# Patient Record
Sex: Female | Born: 1975 | Hispanic: No | Marital: Single | Smoking: Never smoker
Health system: Southern US, Community
[De-identification: ages and names within clinical notes are randomized; demographics above are authoritative.]

## PROBLEM LIST (undated history)

## (undated) DIAGNOSIS — T7840XA Allergy, unspecified, initial encounter: Secondary | ICD-10-CM

## (undated) DIAGNOSIS — D259 Leiomyoma of uterus, unspecified: Secondary | ICD-10-CM

## (undated) HISTORY — DX: Allergy, unspecified, initial encounter: T78.40XA

## (undated) HISTORY — PX: FOOT ARTHRODESIS, MODIFIED MCBRIDE: SUR52

---

## 2001-06-22 ENCOUNTER — Other Ambulatory Visit: Admission: RE | Admit: 2001-06-22 | Discharge: 2001-06-22 | Payer: Self-pay | Admitting: Family Medicine

## 2009-10-27 ENCOUNTER — Other Ambulatory Visit: Admission: RE | Admit: 2009-10-27 | Discharge: 2009-10-27 | Payer: Self-pay | Admitting: Family Medicine

## 2009-10-30 ENCOUNTER — Encounter: Admission: RE | Admit: 2009-10-30 | Discharge: 2009-10-30 | Payer: Self-pay | Admitting: Family Medicine

## 2009-11-14 ENCOUNTER — Encounter: Admission: RE | Admit: 2009-11-14 | Discharge: 2009-11-14 | Payer: Self-pay | Admitting: Family Medicine

## 2010-11-23 ENCOUNTER — Other Ambulatory Visit: Payer: Self-pay | Admitting: Family Medicine

## 2010-11-23 DIAGNOSIS — Z1231 Encounter for screening mammogram for malignant neoplasm of breast: Secondary | ICD-10-CM

## 2010-12-23 ENCOUNTER — Ambulatory Visit
Admission: RE | Admit: 2010-12-23 | Discharge: 2010-12-23 | Disposition: A | Discharge status: Home / Self Care | Payer: BC Managed Care – PPO | Source: Ambulatory Visit | Attending: Family Medicine | Admitting: Family Medicine

## 2010-12-23 DIAGNOSIS — Z1231 Encounter for screening mammogram for malignant neoplasm of breast: Secondary | ICD-10-CM

## 2011-12-31 ENCOUNTER — Other Ambulatory Visit: Payer: Self-pay | Admitting: Family Medicine

## 2011-12-31 DIAGNOSIS — Z1231 Encounter for screening mammogram for malignant neoplasm of breast: Secondary | ICD-10-CM

## 2012-02-11 ENCOUNTER — Ambulatory Visit
Admission: RE | Admit: 2012-02-11 | Discharge: 2012-02-11 | Disposition: A | Discharge status: Home / Self Care | Payer: BC Managed Care – PPO | Source: Ambulatory Visit | Attending: Family Medicine | Admitting: Family Medicine

## 2012-02-11 DIAGNOSIS — Z1231 Encounter for screening mammogram for malignant neoplasm of breast: Secondary | ICD-10-CM

## 2012-12-05 ENCOUNTER — Other Ambulatory Visit: Payer: Self-pay | Admitting: Family Medicine

## 2012-12-05 ENCOUNTER — Other Ambulatory Visit (HOSPITAL_COMMUNITY)
Admission: RE | Admit: 2012-12-05 | Discharge: 2012-12-05 | Disposition: A | Discharge status: Home / Self Care | Payer: BC Managed Care – PPO | Source: Ambulatory Visit | Attending: Family Medicine | Admitting: Family Medicine

## 2012-12-05 DIAGNOSIS — Z124 Encounter for screening for malignant neoplasm of cervix: Secondary | ICD-10-CM | POA: Insufficient documentation

## 2013-03-28 ENCOUNTER — Other Ambulatory Visit: Payer: Self-pay

## 2013-03-28 DIAGNOSIS — Z1231 Encounter for screening mammogram for malignant neoplasm of breast: Secondary | ICD-10-CM

## 2013-04-19 ENCOUNTER — Ambulatory Visit: Payer: BC Managed Care – PPO

## 2013-04-24 ENCOUNTER — Ambulatory Visit
Admission: RE | Admit: 2013-04-24 | Discharge: 2013-04-24 | Disposition: A | Discharge status: Home / Self Care | Payer: BC Managed Care – PPO | Source: Ambulatory Visit

## 2013-04-24 DIAGNOSIS — Z1231 Encounter for screening mammogram for malignant neoplasm of breast: Secondary | ICD-10-CM

## 2014-04-19 ENCOUNTER — Other Ambulatory Visit: Payer: Self-pay

## 2014-04-19 DIAGNOSIS — Z1231 Encounter for screening mammogram for malignant neoplasm of breast: Secondary | ICD-10-CM

## 2014-04-26 ENCOUNTER — Ambulatory Visit
Admission: RE | Admit: 2014-04-26 | Discharge: 2014-04-26 | Disposition: A | Discharge status: Home / Self Care | Payer: PRIVATE HEALTH INSURANCE | Source: Ambulatory Visit

## 2014-04-26 DIAGNOSIS — Z1231 Encounter for screening mammogram for malignant neoplasm of breast: Secondary | ICD-10-CM

## 2015-01-29 ENCOUNTER — Ambulatory Visit (INDEPENDENT_AMBULATORY_CARE_PROVIDER_SITE_OTHER): Payer: Managed Care, Other (non HMO) | Admitting: Family Medicine

## 2015-01-29 VITALS — BP 104/62 | HR 96 | Temp 98.4°F | Resp 18 | Ht 65.0 in | Wt 132.0 lb

## 2015-01-29 DIAGNOSIS — J209 Acute bronchitis, unspecified: Secondary | ICD-10-CM | POA: Diagnosis not present

## 2015-01-29 MED ORDER — ALBUTEROL SULFATE 108 (90 BASE) MCG/ACT IN AEPB: 2.0000 | INHALATION_SPRAY | Freq: Four times a day (QID) | RESPIRATORY_TRACT | Status: DC | PRN

## 2015-01-29 MED ORDER — HYDROCODONE-HOMATROPINE 5-1.5 MG/5ML PO SYRP: 5.0000 mL | ORAL_SOLUTION | Freq: Three times a day (TID) | ORAL | Status: DC | PRN

## 2015-01-29 MED ORDER — AZITHROMYCIN 250 MG PO TABS: ORAL_TABLET | ORAL | Status: DC

## 2015-01-29 NOTE — Progress Notes (Signed)
Subjective:  This chart was scribed for Stephanie Haber MD, [name],scribe, at [facility].  This patient was seen in room  11 and the patient's care was started at 11:02 AM.    Patient ID: Barrie Mann, female    DOB: 05/09/1975, 39 y.o.   MRN: [id number]  Chief Complaint  Patient presents with  . Nasal Congestion    chest congestion started feeling sick since saturday   . Cough    some mucous not much     HPI HPI Comments: Stephanie Mann is a 39 y.o. female who presents to the [facility] complaining of congestion onset a few days ago.  Her symptoms started with a nasal congestion four days ago and woke up three days ago unable to speak as she had lost her voice.  She states that last night, she had a persistent mildly productive cough and was unable to sleep.  Patient works at [employer] and works from home. Patient has had bronchitis in the past. She is not requesting a work note today.    She denies fever/chills currently.   Peak flow: 330, predicted peak flow : 436.     There are no active problems to display for this patient.  Past Medical History  Diagnosis Date  . Allergy    Past Surgical History  Procedure Laterality Date  . Foot arthrodesis, modified mcbride     No Known Allergies Prior to Admission medications   Not on File   Social History   Social History  . Marital Status: Unknown    Spouse Name: N/A  . Number of Children: N/A  . Years of Education: N/A   Occupational History  . Not on file.   Social History Main Topics  . Smoking status: Never Smoker   . Smokeless tobacco: Not on file  . Alcohol Use: No  . Drug Use: No  . Sexual Activity: Not on file   Other Topics Concern  . Not on file   Social History Narrative  . No narrative on file       Review of Systems  Constitutional: Negative for fever and chills.  HENT: Positive for congestion.   Eyes: Negative for pain, redness and itching.    Respiratory: Positive for cough. Negative for choking.   Gastrointestinal: Negative for nausea and vomiting.  Musculoskeletal: Negative for neck pain and neck stiffness.  Skin: Negative for color change.  Neurological: Negative for syncope and speech difficulty.       Objective:   Physical Exam  Constitutional: She is oriented to person, place, and time. She appears well-developed and well-nourished. No distress.  HENT:  Head: Normocephalic and atraumatic.  Right Ear: External ear normal.  Left Ear: External ear normal.  Mouth/Throat: Oropharynx is clear and moist.  Eyes: Pupils are equal, round, and reactive to light.  Neck: Normal range of motion. Neck supple.  Pulmonary/Chest: Effort normal. No respiratory distress.  Coarse breath sounds  Musculoskeletal: Normal range of motion.  Neurological: She is alert and oriented to person, place, and time.  Skin: Skin is warm and dry.  Psychiatric: She has a normal mood and affect. Her behavior is normal.    Filed Vitals:   01/29/15 1043  BP: 104/62  Pulse: 96  Temp: 98.4 F (36.9 C)  TempSrc: Oral  Resp: 18  Height: 5\' 5"  (1.651 m)  Weight: 132 lb (59.875 kg)  SpO2: 98%   Patient is hoarse  Assessment & Plan:   This chart was scribed in my presence and reviewed by me personally.    ICD-9-CM ICD-10-CM   1. Acute bronchitis, unspecified organism 466.0 J20.9 azithromycin (ZITHROMAX) 250 MG tablet     HYDROcodone-homatropine (HYCODAN) 5-1.5 MG/5ML syrup     Albuterol Sulfate (PROAIR RESPICLICK) [id number] (90 BASE) MCG/ACT AEPB     Signed, Stephanie Haber, MD

## 2015-01-29 NOTE — Patient Instructions (Signed)

## 2015-03-26 ENCOUNTER — Other Ambulatory Visit: Payer: Self-pay

## 2015-03-26 DIAGNOSIS — Z1231 Encounter for screening mammogram for malignant neoplasm of breast: Secondary | ICD-10-CM

## 2015-04-29 ENCOUNTER — Ambulatory Visit
Admission: RE | Admit: 2015-04-29 | Discharge: 2015-04-29 | Disposition: A | Discharge status: Home / Self Care | Payer: PRIVATE HEALTH INSURANCE | Source: Ambulatory Visit

## 2015-04-29 DIAGNOSIS — Z1231 Encounter for screening mammogram for malignant neoplasm of breast: Secondary | ICD-10-CM

## 2016-05-25 ENCOUNTER — Other Ambulatory Visit: Payer: Self-pay | Admitting: Family Medicine

## 2016-05-25 DIAGNOSIS — Z1231 Encounter for screening mammogram for malignant neoplasm of breast: Secondary | ICD-10-CM

## 2016-06-16 ENCOUNTER — Ambulatory Visit
Admission: RE | Admit: 2016-06-16 | Discharge: 2016-06-16 | Disposition: A | Discharge status: Home / Self Care | Payer: 59 | Source: Ambulatory Visit | Attending: Family Medicine | Admitting: Family Medicine

## 2016-06-16 DIAGNOSIS — Z1231 Encounter for screening mammogram for malignant neoplasm of breast: Secondary | ICD-10-CM

## 2018-03-21 ENCOUNTER — Other Ambulatory Visit: Payer: Self-pay | Admitting: Family Medicine

## 2018-03-21 ENCOUNTER — Other Ambulatory Visit (HOSPITAL_COMMUNITY)
Admission: RE | Admit: 2018-03-21 | Discharge: 2018-03-21 | Disposition: A | Discharge status: Home / Self Care | Payer: Managed Care, Other (non HMO) | Source: Ambulatory Visit | Attending: Family Medicine | Admitting: Family Medicine

## 2018-03-21 DIAGNOSIS — Z1231 Encounter for screening mammogram for malignant neoplasm of breast: Secondary | ICD-10-CM

## 2018-03-21 DIAGNOSIS — Z124 Encounter for screening for malignant neoplasm of cervix: Secondary | ICD-10-CM | POA: Diagnosis present

## 2018-03-24 ENCOUNTER — Ambulatory Visit
Admission: RE | Admit: 2018-03-24 | Discharge: 2018-03-24 | Disposition: A | Discharge status: Home / Self Care | Payer: Managed Care, Other (non HMO) | Source: Ambulatory Visit | Attending: Family Medicine | Admitting: Family Medicine

## 2018-03-24 DIAGNOSIS — Z1231 Encounter for screening mammogram for malignant neoplasm of breast: Secondary | ICD-10-CM

## 2018-03-28 LAB — CYTOLOGY - PAP
Chlamydia: NEGATIVE
Diagnosis: NEGATIVE
HPV: NOT DETECTED
Neisseria Gonorrhea: NEGATIVE

## 2019-05-22 ENCOUNTER — Other Ambulatory Visit: Payer: Self-pay | Admitting: Family Medicine

## 2019-05-22 DIAGNOSIS — Z1231 Encounter for screening mammogram for malignant neoplasm of breast: Secondary | ICD-10-CM

## 2019-05-23 ENCOUNTER — Other Ambulatory Visit: Payer: Self-pay

## 2019-05-23 ENCOUNTER — Ambulatory Visit
Admission: RE | Admit: 2019-05-23 | Discharge: 2019-05-23 | Disposition: A | Discharge status: Home / Self Care | Payer: No Typology Code available for payment source | Source: Ambulatory Visit | Attending: Family Medicine | Admitting: Family Medicine

## 2019-05-23 DIAGNOSIS — Z1231 Encounter for screening mammogram for malignant neoplasm of breast: Secondary | ICD-10-CM

## 2020-03-31 ENCOUNTER — Encounter (HOSPITAL_COMMUNITY): Admission: RE | Admit: 2020-03-31 | Payer: No Typology Code available for payment source | Source: Ambulatory Visit

## 2020-04-05 ENCOUNTER — Other Ambulatory Visit (HOSPITAL_COMMUNITY)
Admission: RE | Admit: 2020-04-05 | Discharge: 2020-04-05 | Disposition: A | Discharge status: Home / Self Care | Payer: 59 | Source: Ambulatory Visit | Attending: Obstetrics and Gynecology | Admitting: Obstetrics and Gynecology

## 2020-04-05 DIAGNOSIS — Z01812 Encounter for preprocedural laboratory examination: Secondary | ICD-10-CM | POA: Insufficient documentation

## 2020-04-05 DIAGNOSIS — Z20822 Contact with and (suspected) exposure to covid-19: Secondary | ICD-10-CM | POA: Insufficient documentation

## 2020-04-05 LAB — SARS CORONAVIRUS 2 (TAT 6-24 HRS): SARS Coronavirus 2: NEGATIVE

## 2020-04-07 ENCOUNTER — Encounter (HOSPITAL_BASED_OUTPATIENT_CLINIC_OR_DEPARTMENT_OTHER): Payer: Self-pay | Admitting: Obstetrics and Gynecology

## 2020-04-07 ENCOUNTER — Other Ambulatory Visit: Payer: Self-pay

## 2020-04-07 NOTE — Progress Notes (Signed)
Spoke w/ via phone for pre-op interview--- PT Lab needs dos----  Urine preg( per anes) and T&S             Lab results------ no COVID test ------ 04-05-2020 negative result in epic Arrive at ------- 5916 on 04-09-2020 NPO after MN NO Solid Food.  Clear liquids from MN until--- 0915 Medications to take morning of surgery ----- NONE Diabetic medication ----- n/a Patient Special Instructions ----- n/a Pre-Op special Istructions ----- n/a Patient verbalized understanding of instructions that were given at this phone interview. Patient denies shortness of breath, chest pain, fever, cough at this phone interview.

## 2020-04-09 ENCOUNTER — Ambulatory Visit (HOSPITAL_BASED_OUTPATIENT_CLINIC_OR_DEPARTMENT_OTHER)
Admission: RE | Admit: 2020-04-09 | Discharge: 2020-04-09 | Disposition: A | Discharge status: Home / Self Care | Payer: 59 | Source: Ambulatory Visit | Attending: Obstetrics and Gynecology | Admitting: Obstetrics and Gynecology

## 2020-04-09 ENCOUNTER — Encounter (HOSPITAL_BASED_OUTPATIENT_CLINIC_OR_DEPARTMENT_OTHER)
Admission: RE | Disposition: A | Discharge status: Home / Self Care | Payer: Self-pay | Source: Ambulatory Visit | Attending: Obstetrics and Gynecology

## 2020-04-09 ENCOUNTER — Encounter (HOSPITAL_BASED_OUTPATIENT_CLINIC_OR_DEPARTMENT_OTHER): Payer: Self-pay | Admitting: Obstetrics and Gynecology

## 2020-04-09 ENCOUNTER — Ambulatory Visit (HOSPITAL_BASED_OUTPATIENT_CLINIC_OR_DEPARTMENT_OTHER): Payer: 59 | Admitting: Certified Registered"

## 2020-04-09 ENCOUNTER — Other Ambulatory Visit: Payer: Self-pay

## 2020-04-09 DIAGNOSIS — D252 Subserosal leiomyoma of uterus: Secondary | ICD-10-CM | POA: Diagnosis not present

## 2020-04-09 DIAGNOSIS — Z79899 Other long term (current) drug therapy: Secondary | ICD-10-CM | POA: Insufficient documentation

## 2020-04-09 DIAGNOSIS — D251 Intramural leiomyoma of uterus: Secondary | ICD-10-CM | POA: Diagnosis not present

## 2020-04-09 DIAGNOSIS — N803 Endometriosis of pelvic peritoneum: Secondary | ICD-10-CM | POA: Diagnosis not present

## 2020-04-09 DIAGNOSIS — N838 Other noninflammatory disorders of ovary, fallopian tube and broad ligament: Secondary | ICD-10-CM | POA: Diagnosis not present

## 2020-04-09 HISTORY — DX: Leiomyoma of uterus, unspecified: D25.9

## 2020-04-09 HISTORY — PX: LAPAROSCOPIC GELPORT ASSISTED MYOMECTOMY: SHX6549

## 2020-04-09 LAB — TYPE AND SCREEN
ABO/RH(D): O NEG
Antibody Screen: NEGATIVE

## 2020-04-09 LAB — POCT PREGNANCY, URINE: Preg Test, Ur: NEGATIVE

## 2020-04-09 LAB — CBC
HCT: 37.5 % (ref 36.0–46.0)
Hemoglobin: 12.1 g/dL (ref 12.0–15.0)
MCH: 28.8 pg (ref 26.0–34.0)
MCHC: 32.3 g/dL (ref 30.0–36.0)
MCV: 89.3 fL (ref 80.0–100.0)
Platelets: 301 10*3/uL (ref 150–400)
RBC: 4.2 MIL/uL (ref 3.87–5.11)
RDW: 13.2 % (ref 11.5–15.5)
WBC: 4.5 10*3/uL (ref 4.0–10.5)
nRBC: 0 % (ref 0.0–0.2)

## 2020-04-09 LAB — ABO/RH: ABO/RH(D): O NEG

## 2020-04-09 SURGERY — LAPAROSCOPIC GELPORT ASSISTED MYOMECTOMY
Anesthesia: General | Site: Abdomen

## 2020-04-09 MED ORDER — FENTANYL CITRATE (PF) 100 MCG/2ML IJ SOLN
INTRAMUSCULAR | Status: AC
  Filled 2020-04-09: qty 2

## 2020-04-09 MED ORDER — LIDOCAINE 2% (20 MG/ML) 5 ML SYRINGE
INTRAMUSCULAR | Status: DC | PRN
  Administered 2020-04-09: 60 mg via INTRAVENOUS

## 2020-04-09 MED ORDER — ONDANSETRON HCL 4 MG PO TABS: 4.0000 mg | ORAL_TABLET | Freq: Every day | ORAL | 1 refills | Status: AC | PRN

## 2020-04-09 MED ORDER — OXYCODONE HCL 5 MG PO TABS
5.0000 mg | ORAL_TABLET | Freq: Once | ORAL | Status: AC | PRN
  Administered 2020-04-09: 5 mg via ORAL

## 2020-04-09 MED ORDER — CEFAZOLIN SODIUM-DEXTROSE 2-3 GM-%(50ML) IV SOLR
INTRAVENOUS | Status: DC | PRN
  Administered 2020-04-09: 2 g via INTRAVENOUS

## 2020-04-09 MED ORDER — PROPOFOL 10 MG/ML IV BOLUS
INTRAVENOUS | Status: AC
  Filled 2020-04-09: qty 20

## 2020-04-09 MED ORDER — ROCURONIUM BROMIDE 10 MG/ML (PF) SYRINGE
PREFILLED_SYRINGE | INTRAVENOUS | Status: DC | PRN
  Administered 2020-04-09: 60 mg via INTRAVENOUS

## 2020-04-09 MED ORDER — SUGAMMADEX SODIUM 200 MG/2ML IV SOLN
INTRAVENOUS | Status: DC | PRN
  Administered 2020-04-09: 200 mg via INTRAVENOUS

## 2020-04-09 MED ORDER — FENTANYL CITRATE (PF) 100 MCG/2ML IJ SOLN: 25.0000 ug | INTRAMUSCULAR | Status: DC | PRN

## 2020-04-09 MED ORDER — DEXAMETHASONE SODIUM PHOSPHATE 10 MG/ML IJ SOLN
INTRAMUSCULAR | Status: AC
  Filled 2020-04-09: qty 1

## 2020-04-09 MED ORDER — OXYCODONE-ACETAMINOPHEN 7.5-325 MG PO TABS: 1.0000 | ORAL_TABLET | ORAL | 0 refills | Status: AC | PRN

## 2020-04-09 MED ORDER — AMISULPRIDE (ANTIEMETIC) 5 MG/2ML IV SOLN: 10.0000 mg | Freq: Once | INTRAVENOUS | Status: DC | PRN

## 2020-04-09 MED ORDER — SODIUM CHLORIDE 0.9 % IR SOLN
Status: DC | PRN
  Administered 2020-04-09: 300 mL

## 2020-04-09 MED ORDER — CEFAZOLIN SODIUM 1 G IJ SOLR
INTRAMUSCULAR | Status: AC
  Filled 2020-04-09: qty 20

## 2020-04-09 MED ORDER — ONDANSETRON HCL 4 MG/2ML IJ SOLN
INTRAMUSCULAR | Status: DC | PRN
  Administered 2020-04-09: 4 mg via INTRAVENOUS

## 2020-04-09 MED ORDER — POVIDONE-IODINE 10 % EX SWAB: 2.0000 "application " | Freq: Once | CUTANEOUS | Status: DC

## 2020-04-09 MED ORDER — OXYCODONE HCL 5 MG/5ML PO SOLN: 5.0000 mg | Freq: Once | ORAL | Status: AC | PRN

## 2020-04-09 MED ORDER — PROPOFOL 10 MG/ML IV BOLUS
INTRAVENOUS | Status: DC | PRN
  Administered 2020-04-09: 200 mg via INTRAVENOUS

## 2020-04-09 MED ORDER — METHYLENE BLUE 0.5 % INJ SOLN
INTRAVENOUS | Status: DC | PRN
  Administered 2020-04-09: 1 mL via SUBMUCOSAL

## 2020-04-09 MED ORDER — BUPIVACAINE-EPINEPHRINE 0.5% -1:200000 IJ SOLN
INTRAMUSCULAR | Status: DC | PRN
  Administered 2020-04-09: 10 mL

## 2020-04-09 MED ORDER — MIDAZOLAM HCL 5 MG/5ML IJ SOLN
INTRAMUSCULAR | Status: DC | PRN
  Administered 2020-04-09: 2 mg via INTRAVENOUS

## 2020-04-09 MED ORDER — VASOPRESSIN 20 UNIT/ML IV SOLN
INTRAVENOUS | Status: DC | PRN
  Administered 2020-04-09: 39 mL via INTRAMUSCULAR

## 2020-04-09 MED ORDER — DEXAMETHASONE SODIUM PHOSPHATE 10 MG/ML IJ SOLN
INTRAMUSCULAR | Status: DC | PRN
  Administered 2020-04-09: 10 mg via INTRAVENOUS

## 2020-04-09 MED ORDER — ONDANSETRON HCL 4 MG/2ML IJ SOLN: 4.0000 mg | Freq: Once | INTRAMUSCULAR | Status: DC | PRN

## 2020-04-09 MED ORDER — KETOROLAC TROMETHAMINE 30 MG/ML IJ SOLN
INTRAMUSCULAR | Status: DC | PRN
  Administered 2020-04-09: 30 mg via INTRAVENOUS

## 2020-04-09 MED ORDER — MIDAZOLAM HCL 2 MG/2ML IJ SOLN
INTRAMUSCULAR | Status: AC
  Filled 2020-04-09: qty 2

## 2020-04-09 MED ORDER — KETOROLAC TROMETHAMINE 30 MG/ML IJ SOLN
INTRAMUSCULAR | Status: AC
  Filled 2020-04-09: qty 1

## 2020-04-09 MED ORDER — LACTATED RINGERS IV SOLN: INTRAVENOUS | Status: DC

## 2020-04-09 MED ORDER — OXYCODONE HCL 5 MG PO TABS
ORAL_TABLET | ORAL | Status: AC
  Filled 2020-04-09: qty 1

## 2020-04-09 MED ORDER — FENTANYL CITRATE (PF) 250 MCG/5ML IJ SOLN
INTRAMUSCULAR | Status: DC | PRN
  Administered 2020-04-09 (×2): 25 ug via INTRAVENOUS
  Administered 2020-04-09: 50 ug via INTRAVENOUS

## 2020-04-09 MED ORDER — ONDANSETRON HCL 4 MG/2ML IJ SOLN
INTRAMUSCULAR | Status: AC
  Filled 2020-04-09: qty 2

## 2020-04-09 SURGICAL SUPPLY — 78 items
ADH SKN CLS APL DERMABOND .7 (GAUZE/BANDAGES/DRESSINGS) ×1
BAG RETRIEVAL 10 (BASKET)
BLADE EXTENDED COATED 6.5IN (ELECTRODE) IMPLANT
BLADE HEX COATED 2.75 (ELECTRODE) IMPLANT
BLADE SURG 10 STRL SS (BLADE) ×2 IMPLANT
BLADE SURG 15 STRL LF DISP TIS (BLADE) ×1 IMPLANT
BLADE SURG 15 STRL SS (BLADE) ×2
BRR ADH 6X5 SEPRAFILM 1 SHT (MISCELLANEOUS) ×2
CABLE HIGH FREQUENCY MONO STRZ (ELECTRODE) ×2 IMPLANT
CLEANER CAUTERY TIP 5X5 PAD (MISCELLANEOUS) IMPLANT
COVER MAYO STAND STRL (DRAPES) ×2 IMPLANT
COVER WAND RF STERILE (DRAPES) ×2 IMPLANT
DERMABOND ADVANCED (GAUZE/BANDAGES/DRESSINGS) ×1
DERMABOND ADVANCED .7 DNX12 (GAUZE/BANDAGES/DRESSINGS) ×1 IMPLANT
DRSG OPSITE POSTOP 3X4 (GAUZE/BANDAGES/DRESSINGS) IMPLANT
DRSG TEGADERM 4X4.75 (GAUZE/BANDAGES/DRESSINGS) IMPLANT
DRSG TELFA 3X8 NADH (GAUZE/BANDAGES/DRESSINGS) IMPLANT
DURAPREP 26ML APPLICATOR (WOUND CARE) ×2 IMPLANT
ELECT NEEDLE TIP 2.8 STRL (NEEDLE) IMPLANT
ELECT REM PT RETURN 9FT ADLT (ELECTROSURGICAL) ×2
ELECTRODE REM PT RTRN 9FT ADLT (ELECTROSURGICAL) ×1 IMPLANT
GAUZE 4X4 16PLY RFD (DISPOSABLE) ×2 IMPLANT
GLOVE SURG ENC MOIS LTX SZ6.5 (GLOVE) ×4 IMPLANT
GLOVE SURG ENC MOIS LTX SZ8 (GLOVE) ×6 IMPLANT
GLOVE SURG SS PI 7.0 STRL IVOR (GLOVE) ×4 IMPLANT
GLOVE SURG UNDER POLY LF SZ7 (GLOVE) ×4 IMPLANT
GOWN STRL REUS W/ TWL LRG LVL3 (GOWN DISPOSABLE) ×1 IMPLANT
GOWN STRL REUS W/TWL LRG LVL3 (GOWN DISPOSABLE) ×4 IMPLANT
GOWN STRL REUS W/TWL XL LVL3 (GOWN DISPOSABLE) ×2 IMPLANT
HOLDER FOLEY CATH W/STRAP (MISCELLANEOUS) IMPLANT
IV NS IRRIG 3000ML ARTHROMATIC (IV SOLUTION) ×2 IMPLANT
KIT TURNOVER CYSTO (KITS) ×2 IMPLANT
MANIFOLD NEPTUNE II (INSTRUMENTS) ×2 IMPLANT
MANIPULATOR UTERINE 4.5 ZUMI (MISCELLANEOUS) ×2 IMPLANT
NDL SAFETY ECLIPSE 18X1.5 (NEEDLE) ×1 IMPLANT
NEEDLE FILTER BLUNT 18X 1/2SAF (NEEDLE) ×1
NEEDLE FILTER BLUNT 18X1 1/2 (NEEDLE) ×1 IMPLANT
NEEDLE HYPO 18GX1.5 SHARP (NEEDLE) ×2
NEEDLE HYPO 22GX1.5 SAFETY (NEEDLE) ×4 IMPLANT
NEEDLE INSUFFLATION 120MM (ENDOMECHANICALS) ×2 IMPLANT
NS IRRIG 500ML POUR BTL (IV SOLUTION) ×2 IMPLANT
PACK LAPAROSCOPY BASIN (CUSTOM PROCEDURE TRAY) ×2 IMPLANT
PACK TRENDGUARD 450 HYBRID PRO (MISCELLANEOUS) ×1 IMPLANT
PAD CLEANER CAUTERY TIP 5X5 (MISCELLANEOUS)
PAD OB MATERNITY 4.3X12.25 (PERSONAL CARE ITEMS) ×2 IMPLANT
PENCIL SMOKE EVACUATOR (MISCELLANEOUS) ×2 IMPLANT
SEPRAFILM MEMBRANE 5X6 (MISCELLANEOUS) ×4 IMPLANT
SET SUCTION IRRIG HYDROSURG (IRRIGATION / IRRIGATOR) ×2 IMPLANT
SET TRI-LUMEN FLTR TB AIRSEAL (TUBING) IMPLANT
SET TUBE SMOKE EVAC HIGH FLOW (TUBING) IMPLANT
SHEARS HARMONIC ACE PLUS 36CM (ENDOMECHANICALS) IMPLANT
SPONGE LAP 18X18 RF (DISPOSABLE) IMPLANT
SPONGE LAP 4X18 RFD (DISPOSABLE) ×2 IMPLANT
STOPCOCK 4 WAY LG BORE MALE ST (IV SETS) ×2 IMPLANT
SUT MNCRL AB 4-0 PS2 18 (SUTURE) ×4 IMPLANT
SUT VIC AB 0 CT1 36 (SUTURE) IMPLANT
SUT VIC AB 2-0 CT1 (SUTURE) ×10 IMPLANT
SUT VIC AB 2-0 CT2 27 (SUTURE) ×4 IMPLANT
SUT VIC AB 2-0 UR6 27 (SUTURE) IMPLANT
SUT VIC AB 4-0 SH 27 (SUTURE) ×4
SUT VIC AB 4-0 SH 27XANBCTRL (SUTURE) ×2 IMPLANT
SYR 10ML LL (SYRINGE) ×2 IMPLANT
SYR 30ML LL (SYRINGE) ×2 IMPLANT
SYR 50ML LL SCALE MARK (SYRINGE) ×2 IMPLANT
SYR 5ML LL (SYRINGE) ×4 IMPLANT
SYR CONTROL 10ML LL (SYRINGE) ×4 IMPLANT
SYS BAG RETRIEVAL 10MM (BASKET)
SYS LAPSCP GELPORT 120MM (MISCELLANEOUS) ×2
SYSTEM BAG RETRIEVAL 10MM (BASKET) IMPLANT
SYSTEM LAPSCP GELPORT 120MM (MISCELLANEOUS) ×1 IMPLANT
TOWEL OR 17X26 10 PK STRL BLUE (TOWEL DISPOSABLE) ×2 IMPLANT
TRAY FOLEY W/BAG SLVR 14FR LF (SET/KITS/TRAYS/PACK) ×2 IMPLANT
TRENDGUARD 450 HYBRID PRO PACK (MISCELLANEOUS) ×2
TROCAR OPTI TIP 5M 100M (ENDOMECHANICALS) ×4 IMPLANT
TROCAR PORT AIRSEAL 5X120 (TROCAR) IMPLANT
TROCAR XCEL DIL TIP R 11M (ENDOMECHANICALS) IMPLANT
WARMER LAPAROSCOPE (MISCELLANEOUS) ×2 IMPLANT
WATER STERILE IRR 500ML POUR (IV SOLUTION) IMPLANT

## 2020-04-09 NOTE — Anesthesia Preprocedure Evaluation (Signed)
Anesthesia Evaluation  Patient identified by MRN, date of birth, ID band Patient awake    Reviewed: Allergy & Precautions, NPO status , Patient's Chart, lab work & pertinent test results  History of Anesthesia Complications Negative for: history of anesthetic complications  Airway Mallampati: II  TM Distance: >3 FB Neck ROM: Full    Dental  (+) Teeth Intact   Pulmonary neg pulmonary ROS,    Pulmonary exam normal        Cardiovascular negative cardio ROS Normal cardiovascular exam     Neuro/Psych negative neurological ROS  negative psych ROS   GI/Hepatic negative GI ROS, Neg liver ROS,   Endo/Other  negative endocrine ROS  Renal/GU negative Renal ROS  negative genitourinary   Musculoskeletal negative musculoskeletal ROS (+)   Abdominal   Peds  Hematology negative hematology ROS (+)   Anesthesia Other Findings   Reproductive/Obstetrics Uterine leiomyoma                             Anesthesia Physical Anesthesia Plan  ASA: I  Anesthesia Plan: General   Post-op Pain Management:    Induction: Intravenous  PONV Risk Score and Plan: 4 or greater and Ondansetron, Dexamethasone, Treatment may vary due to age or medical condition and Midazolam  Airway Management Planned: Oral ETT  Additional Equipment: None  Intra-op Plan:   Post-operative Plan: Extubation in OR  Informed Consent: I have reviewed the patients History and Physical, chart, labs and discussed the procedure including the risks, benefits and alternatives for the proposed anesthesia with the patient or authorized representative who has indicated his/her understanding and acceptance.     Dental advisory given  Plan Discussed with:   Anesthesia Plan Comments:         Anesthesia Quick Evaluation

## 2020-04-09 NOTE — Transfer of Care (Signed)
Immediate Anesthesia Transfer of Care Note  Patient: Gwendloyn Forsee  Procedure(s) Performed: LAPAROSCOPIC GELPORT ASSISTED MYOMECTOMY; CHROMOTUBATION, FULGARATION OF PELVIC ENDOMETRIOSIS (N/A Abdomen)  Patient Location: PACU  Anesthesia Type:General  Level of Consciousness: sedated and responds to stimulation  Airway & Oxygen Therapy: Patient Spontanous Breathing and Patient connected to nasal cannula oxygen  Post-op Assessment: Report given to RN, Post -op Vital signs reviewed and stable and Patient moving all extremities  Post vital signs: Reviewed and stable  Last Vitals:  Vitals Value Taken Time  BP 116/69 04/09/20 1520  Temp 36.4 C 04/09/20 1520  Pulse 71 04/09/20 1521  Resp 16 04/09/20 1521  SpO2 100 % 04/09/20 1521  Vitals shown include unvalidated device data.  Last Pain:  Vitals:   04/09/20 1052  TempSrc: Oral  PainSc: 0-No pain      Patients Stated Pain Goal: 5 (48/25/00 3704)  Complications: No complications documented.

## 2020-04-09 NOTE — Anesthesia Procedure Notes (Signed)
Procedure Name: Intubation Date/Time: 04/09/2020 1:08 PM Performed by: Myna Bright, CRNA Pre-anesthesia Checklist: Patient identified, Emergency Drugs available, Suction available and Patient being monitored Patient Re-evaluated:Patient Re-evaluated prior to induction Oxygen Delivery Method: Circle system utilized Preoxygenation: Pre-oxygenation with 100% oxygen Induction Type: IV induction Ventilation: Mask ventilation without difficulty Laryngoscope Size: Mac and 3 Grade View: Grade I Tube type: Oral Tube size: 7.0 mm Number of attempts: 1 Airway Equipment and Method: Stylet Placement Confirmation: ETT inserted through vocal cords under direct vision,  positive ETCO2 and breath sounds checked- equal and bilateral Secured at: 21 cm Tube secured with: Tape Dental Injury: Teeth and Oropharynx as per pre-operative assessment

## 2020-04-09 NOTE — Discharge Instructions (Signed)
Post Anesthesia Home Care Instructions  Activity: Get plenty of rest for the remainder of the day. A responsible individual must stay with you for 24 hours following the procedure.  For the next 24 hours, DO NOT: -Drive a car -Paediatric nurse -Drink alcoholic beverages -Take any medication unless instructed by your physician -Make any legal decisions or sign important papers.  Meals: Start with liquid foods such as gelatin or soup. Progress to regular foods as tolerated. Avoid greasy, spicy, heavy foods. If nausea and/or vomiting occur, drink only clear liquids until the nausea and/or vomiting subsides. Call your physician if vomiting continues.  Special Instructions/Symptoms: Your throat may feel dry or sore from the anesthesia or the breathing tube placed in your throat during surgery. If this causes discomfort, gargle with warm salt water. The discomfort should disappear within 24 hours.  If you had a scopolamine patch placed behind your ear for the management of post- operative nausea and/or vomiting:  1. The medication in the patch is effective for 72 hours, after which it should be removed.  Wrap patch in a tissue and discard in the trash. Wash hands thoroughly with soap and water. 2. You may remove the patch earlier than 72 hours if you experience unpleasant side effects which may include dry mouth, dizziness or visual disturbances. 3. Avoid touching the patch. Wash your hands with soap and water after contact with the patch.    No ibuprofen, Advil, Aleve, Motrin, or naproxen until after 9 pm today if needed.   Myomectomy, Care After The following information offers guidance on how to care for yourself after your procedure. Your health care provider may also give you more specific instructions. If you have problems or questions, contact your health care provider. What can I expect after the procedure? After the procedure, it is common to have:  Pain in your abdomen and at  the incision sites.  Vaginal bleeding. Follow these instructions at home: Medicines  Take over-the-counter and prescription medicines only as told by your health care provider.  If you were prescribed an antibiotic medicine, take it as told by your health care provider. Do not stop using the antibiotic even if you start to feel better.  If you are taking blood thinners: ? Talk with your health care provider before you take any medicines that contain aspirin or NSAIDs, such as ibuprofen. These medicines increase your risk for dangerous bleeding. ? Take your medicine exactly as told, at the same time every day. ? Avoid activities that could cause injury or bruising, and follow instructions about how to prevent falls. ? Wear a medical alert bracelet or carry a card that lists what medicines you take.  Ask your health care provider if the medicine prescribed to you: ? Requires you to avoid driving or using machinery. ? Can cause constipation. You may need to take these actions to prevent or treat constipation:  Drink enough fluid to keep your urine pale yellow.  Take over-the-counter or prescription medicines.  Eat foods that are high in fiber, such as beans, whole grains, and fresh fruits and vegetables.  Limit foods that are high in fat and processed sugars, such as fried or sweet foods. Incision care  Follow instructions from your health care provider about how to take care of any incisions. Make sure you: ? Wash your hands with soap and water for at least 20 seconds before and after you change your bandage (dressing). If soap and water are not available, use hand sanitizer. ?  Change your dressing as told by your health care provider. ? Leave stitches (sutures), skin glue, or adhesive strips in place. These skin closures may need to stay in place for 2 weeks or longer. If adhesive strip edges start to loosen and curl up, you may trim the loose edges. Do not remove adhesive strips  completely unless your health care provider tells you to do that.  Check your incision areas every day for signs of infection. Check for: ? Redness, swelling, or pain. ? More fluid or blood. ? Warmth. ? Pus or a bad smell.   Activity  Do not lift anything that is heavier than 5 lb (2.3 kg), or the limit that you are told, until your health care provider says that it is safe.  Return to your normal activities as told by your health care provider. Ask your health care provider what activities are safe for you.  Rest as told by your health care provider.  Avoid sitting for a long time without moving. Get up to take short walks every 1-2 hours. This is important to improve blood flow and breathing. Ask for help if you feel weak or unsteady.  Do not douche, use tampons, or have sexual intercourse until your health care provider approves.  Practice deep breathing and coughing. If it hurts to cough, try holding a pillow against your belly as you cough.   General instructions  Do not use any products that contain nicotine or tobacco. These products include cigarettes, chewing tobacco, and vaping devices, such as e-cigarettes. These can delay incision healing. If you need help quitting, ask your health care provider.  Do not take baths, swim, or use a hot tub until your health care provider approves. Ask your health care provider if you may take showers. You may only be allowed to take sponge baths.  Do not drink alcohol until your health care provider says it is okay.  Wear compression stockings as told by your health care provider. These stockings help to prevent blood clots and reduce swelling in your legs.  Keep all follow-up visits. This is important. Contact a health care provider if:  You have a fever or chills.  You have more redness, swelling, or pain around your incision.  You have fluid, blood, pus, or a bad smell coming from your incision.  Your incision feels warm to the  touch.  You have a rash.  You have pain when you urinate or blood in your urine.  You have nausea, vomiting, or diarrhea. Get help right away if:  You have trouble breathing or shortness of breath.  You have chest pain.  You feel weak or light-headed.  You have pain, swelling, or redness in your legs.  You become dizzy and faint.  You have heavy vaginal bleeding or blood clots that are larger than a quarter in size.  You have an incision that is opening up.  You have increasing abdominal pain that does not get better with medicine. These symptoms may represent a serious problem that is an emergency. Do not wait to see if the symptoms will go away. Get medical help right away. Call your local emergency services (911 in the [place] Do not drive yourself to the hospital. Summary  After the procedure, it is common to have pain at the incision sites.  If you were prescribed an antibiotic medicine, take it as told by your health care provider. Do not stop using the antibiotic even if you start to feel  better.  Do not douche, use tampons, or have sexual intercourse until your health care provider approves.  Return to your normal activities as told by your health care provider. Ask your health care provider what activities are safe for you. This information is not intended to replace advice given to you by your health care provider. Make sure you discuss any questions you have with your health care provider. Document Revised: 09/20/2019 Document Reviewed: 09/20/2019 Elsevier Patient Education  [employer].

## 2020-04-09 NOTE — H&P (Addendum)
Stephanie Mann is a 45 y.o. female , originally referred to me by Dr. , for laparoscopic assisted myomectomy.  She was diagnosed with fibroids because of abnormal uterine bleeding and was placed on progestins.  She developed breakthrough bleeding and had to stop the pills.  She has been having monthly periods but with heavy flow and prolonged duration. TVUS in September 2021 showed: Uterus enlarged with 2 large myomas 10 x 7 cm and 9 x 8 cm. Posterior left tm myoma displacing the endometrium to the right.  bAFC is 4. I had a long discussion with pt regarding treatment options since menorrhagia continues off and on; she has not started [place] yet. BUAE vs L/S Gelport assisted myomectomy. She wishes to conceive one day but does not have a partner. I discussed B/R/As to both procedures and gave the referral info for Dr Pecola Lawless at [facility].  Patient opted for myomectomy.  In addition she was unable to take the [facility] antagonist prescription that I prescribed her since her insurance did not cover it.  Patient would like to preserve her childbearing potential.  Pertinent Gynecological History: Menses: flow is excessive with use of 3 pads or tampons on heaviest days Bleeding: dysfunctional uterine bleeding Contraception: none DES exposure: denies Blood transfusions: none Sexually transmitted diseases: no past history Previous GYN Procedures: Last mammogram: normal Last pap: normal   Menstrual History: Menarche age: 29 No LMP recorded.    Past Medical History:  Diagnosis Date  . Uterine leiomyoma                     Past Surgical History:  Procedure Laterality Date  . BUNIONECTOMY Right 2005             Family History  Problem Relation Age of Onset  . Cancer Mother   . Breast cancer Mother   . Diabetes Father   . Hypertension Father   . Mental illness Father   . Mental illness Maternal Grandmother   . Mental illness Paternal Grandmother   . Cancer Paternal Grandfather    No  hereditary disease.  No cancer of breast, ovary, uterus. No cutaneous leiomyomatosis or renal cell carcinoma.  Social History   Socioeconomic History  . Marital status: Single    Spouse name: Not on file  . Number of children: Not on file  . Years of education: Not on file  . Highest education level: Not on file  Occupational History  . Not on file  Tobacco Use  . Smoking status: Never Smoker  . Smokeless tobacco: Never Used  Vaping Use  . Vaping Use: Never used  Substance and Sexual Activity  . Alcohol use: No    Alcohol/week: 0.0 standard drinks  . Drug use: Never  . Sexual activity: Not on file  Other Topics Concern  . Not on file  Social History Narrative  . Not on file   Social Determinants of Health   Financial Resource Strain: Not on file  Food Insecurity: Not on file  Transportation Needs: Not on file  Physical Activity: Not on file  Stress: Not on file  Social Connections: Not on file  Intimate Partner Violence: Not on file    No Known Allergies  No current facility-administered medications on file prior to encounter.   Current Outpatient Medications on File Prior to Encounter  Medication Sig Dispense Refill  . Cholecalciferol (VITAMIN D-3) 125 MCG (5000 UT) TABS Take 5,000 Units by mouth 2 (two) times a week.    Marland Kitchen  ferrous sulfate 325 (65 FE) MG tablet Take 325 mg by mouth daily with breakfast.    . Turmeric 450 MG CAPS Take 450 mg by mouth daily.    . Elagolix Sodium (ORILISSA) 200 MG TABS Take 200 mg by mouth in the morning and at bedtime.       Review of Systems  Constitutional: Negative.   HENT: Negative.   Eyes: Negative.   Respiratory: Negative.   Cardiovascular: Negative.   Gastrointestinal: Negative.   Genitourinary: Negative.   Musculoskeletal: Negative.   Skin: Negative.   Neurological: Negative.   Endo/Heme/Allergies: Negative.   Psychiatric/Behavioral: Negative.      Physical Exam  BP 108/76   Pulse 70   Temp 97.8 F (36.6 C)  (Oral)   Ht 5\' 5"  (1.651 m)   Wt 62.9 kg   LMP 04/07/2020 (Exact Date)   SpO2 100%   BMI 23.06 kg/m  Constitutional: She is oriented to person, place, and time. She appears well-developed and well-nourished.  HENT:  Head: Normocephalic and atraumatic.  Nose: Nose normal.  Mouth/Throat: Oropharynx is clear and moist. No oropharyngeal exudate.  Eyes: Conjunctivae normal and EOM are normal. Pupils are equal, round, and reactive to light. No scleral icterus.  Neck: Normal range of motion. Neck supple. No tracheal deviation present. No thyromegaly present.  Cardiovascular: Normal rate.   Respiratory: Effort normal and breath sounds normal.  GI: Soft. Bowel sounds are normal. She exhibits no distension and no mass. There is no tenderness.  Lymphadenopathy:    She has no cervical adenopathy.  Neurological: She is alert and oriented to person, place, and time. She has normal reflexes.  Skin: Skin is warm.  Psychiatric: She has a normal mood and affect. Her behavior is normal. Judgment and thought content normal.       Assessment/Plan:  Very large intramural and subserosal uterine myomas, causing menorrhagia and pressure sensation. Preoperative for laparoscopy GelPort assisted myomectomy Benefits and risks of laparoscopy GelPort assisted myomectomy were discussed with the patient and her family member again.  Bowel prep instructions were given.  All of patient's questions were answered.  She verbalized understanding.  She knows that she will need a cesarean delivery for future pregnancies, and that it is recommended she does not conceive for 2-3 months for uterus to heal. Governor Specking, MD

## 2020-04-09 NOTE — Op Note (Signed)
Operative Note  Preoperative diagnosis: Uterine fibroids, menorrhagia  Postoperative diagnosis: Uterine fibroids, intramural and subserosal, stage I endometriosis of pelvic peritoneum, menorrhagia  Procedure: Laparoscopy, GelPort assisted myomectomy (540 grams), fulguration of pelvic peritoneal lesions, chromotubation  Anesthesia: Gen. 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 The vagina was normal. The cervix was nulliparous and appeared grossly normal. The uterus was 21-22 week size, firm and mobile with irregularities caused by myomas. It sounded to 7.5 cm.  On laparoscopy, upper abdomen, liver surface and diaphragm surfaces were normal. Gallbladder was normal. The appendix appeared normal.  The uterus contained a 10 x 9 cm left posterior intramural/subserosal myoma and a 9 x 8 cm left fundal subserosal myoma.  A 3 x 3 cm intramural myoma was also removed from the vicinity of the other intramural myoma.  There were 2 posterior pedunculated myomas each measuring 1 x 1.5 cm which were not removed. The left tube and ovary appeared normal. The right tube and the ovary appeared normal.  Chromotubation did not result in filling of the fallopian tubes but I believe this was due to a technical artifact since the proximal and distal portion of the tubes appeared normal. The pelvic peritoneum stellate fibrotic lesions of endometriosis in several areas in the posterior cul-de-sac and these were ablated with needle electrode.   Description of the procedure: The patient was placed in dorsal supine position and general endotracheal anesthesia was given. 2 g of cefazolin were given intravenously for prophylaxis. Patient was placed in lithotomy position. She was prepped and draped in sterile manner.  The bladder was straight  catheterized . A ZUMI catheter was placed into the uterine cavity. This was connected to a syringe containing diluted methylene blue solution which was used to define the endometrium during the myomectomy. The uterus sounded to 7.5 cm The surgeon was regloved and a surgical field was created on the abdomen.  After preemptive anesthesia of all surgical sites with 0.5% bupivacaine, a 5 mm supra-umbilical skin incision was made and a Verress needle was inserted. Its correct location was confirmed. A pneumoperitoneum was created with carbon dioxide.  5 mm laparoscope with a 30 lens was inserted and video laparoscopy was started . A right lower quadrant  5 mm incision was made and ancillary trochar was placed under direct visualization. Above findings were noted.  A dilute solution of vasopressin (0.4 units per mL) was injected into the myometrium overlying the fundal myoma, until the myometrium blanched. A needle electrode with 80 W of cutting current  was used to make a longitudinal incision on the myometrium overlying the fundal 9 cm myoma. The myoma was grasped with tenaculum and dissection was started.  We then made a 4 cm transverse suprapubic incision to insert a GelPort. After dissection of the anatomic layers, the peritoneal cavity was entered. A GelPort was placed and the rest of the case was performed either using this port as a laparoscopic port or as a minilaparotomy port.  The rest of the myoma had to be in situ morcellated to eventually take it out of this 4 cm incision. This was carried out by blunt and sharp dissection and in situ morcellation was performed with #10 blade.  Through the defect created to to perform this myomectomy we could now reach the posterior intramural large myoma and this was also removed by blunt and sharp  dissection.  A third intramural myoma measuring 3 cm was also removed.  The excess superficial myometrium was trimmed off and the remaining myoma defect was closed in 4  layers: The first 2 layers were a pursestring and then a deep myometrial suture of 2-0 Vicryl continuous interlocking suture, the second layer was a superficial myometrial layer of 2-0 Vicryl continuous suture. A 4-0 Vicryl continuous suture was placed on the serosa and the most superficial myometrium.  Attention was then turned to the 5 x 4 cm solid mass on the superior pole of the right ovary. 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 The mass was carefully separated from the normal ovarian stroma by sharp and electrosurgical dissection and was taken out of the GelPort incision after partial morcellation. Hemostasis was achieved on the remaining right ovary. The suprapubic fascial incision was closed with 2-0 Vicryl continuous suture. Subcutaneous tissue was irrigated and aspirated good hemostasis was achieved. The abdomen and the pelvis was carefully inspected under laparoscopic visualization and the pelvis was copiously irrigated and aspirated. A slurry of 2 sheets of Seprafilm in 60 mL of normal saline was injected as an adhesion barrier into the pelvis. The gas was allowed to escape. The instrument and the lap pad count were correct. The trochars were removed. The skin incisions were approximated with 4-0 Monocryl in subcuticular sutures, including the 4 cm skin incision that belonged to the Crawford site.  The patient tolerated the procedure well and was transferred to recovery room in satisfactory condition.  SPECIAL NOTE: Because of the extent of the myometrial incision during the uterine myomectomy, it is recommended that this patient deliver by a cesarean section with her future pregnancies.  Governor Specking, MD

## 2020-04-10 NOTE — Anesthesia Postprocedure Evaluation (Signed)
Anesthesia Post Note  Patient: Stephanie Mann  Procedure(s) Performed: LAPAROSCOPIC GELPORT ASSISTED MYOMECTOMY; CHROMOTUBATION, FULGARATION OF PELVIC ENDOMETRIOSIS (N/A Abdomen)     Patient location during evaluation: PACU Anesthesia Type: General Level of consciousness: awake and alert Pain management: pain level controlled Vital Signs Assessment: post-procedure vital signs reviewed and stable Respiratory status: spontaneous breathing, nonlabored ventilation, respiratory function stable and patient connected to nasal cannula oxygen Cardiovascular status: blood pressure returned to baseline and stable Postop Assessment: no apparent nausea or vomiting Anesthetic complications: no   No complications documented.  Last Vitals:  Vitals:   04/09/20 1530 04/09/20 1545  BP: 121/75 128/78  Pulse: 78 62  Resp: 13 13  Temp:    SpO2: 100% 100%    Last Pain:  Vitals:   04/10/20 1007  TempSrc:   PainSc: 5                  Tiajuana Amass

## 2020-04-11 ENCOUNTER — Encounter (HOSPITAL_BASED_OUTPATIENT_CLINIC_OR_DEPARTMENT_OTHER): Payer: Self-pay | Admitting: Obstetrics and Gynecology

## 2020-04-11 LAB — SURGICAL PATHOLOGY

## 2020-05-22 ENCOUNTER — Other Ambulatory Visit: Payer: Self-pay | Admitting: Family Medicine

## 2020-05-22 DIAGNOSIS — Z1231 Encounter for screening mammogram for malignant neoplasm of breast: Secondary | ICD-10-CM

## 2020-05-27 ENCOUNTER — Ambulatory Visit
Admission: RE | Admit: 2020-05-27 | Discharge: 2020-05-27 | Disposition: A | Discharge status: Home / Self Care | Payer: 59 | Source: Ambulatory Visit | Attending: Family Medicine | Admitting: Family Medicine

## 2020-05-27 ENCOUNTER — Other Ambulatory Visit: Payer: Self-pay

## 2020-05-27 DIAGNOSIS — Z1231 Encounter for screening mammogram for malignant neoplasm of breast: Secondary | ICD-10-CM

## 2020-06-03 ENCOUNTER — Other Ambulatory Visit: Payer: Self-pay | Admitting: Family Medicine

## 2021-06-02 ENCOUNTER — Other Ambulatory Visit (HOSPITAL_BASED_OUTPATIENT_CLINIC_OR_DEPARTMENT_OTHER): Payer: Self-pay | Admitting: Family Medicine

## 2021-06-02 DIAGNOSIS — Z1231 Encounter for screening mammogram for malignant neoplasm of breast: Secondary | ICD-10-CM

## 2021-06-09 ENCOUNTER — Ambulatory Visit (HOSPITAL_BASED_OUTPATIENT_CLINIC_OR_DEPARTMENT_OTHER)
Admission: RE | Admit: 2021-06-09 | Discharge: 2021-06-09 | Disposition: A | Discharge status: Home / Self Care | Payer: Medicaid Other | Source: Ambulatory Visit | Attending: Family Medicine | Admitting: Family Medicine

## 2021-06-09 ENCOUNTER — Encounter (HOSPITAL_BASED_OUTPATIENT_CLINIC_OR_DEPARTMENT_OTHER): Payer: Self-pay

## 2021-06-09 DIAGNOSIS — Z1231 Encounter for screening mammogram for malignant neoplasm of breast: Secondary | ICD-10-CM | POA: Insufficient documentation

## 2022-02-02 IMAGING — MG DIGITAL SCREENING BILAT W/ CAD
4 series · 4 of 4 positions shown · non-contrast
Comparison: Previous exam(s).

CLINICAL DATA: Screening.

EXAM:
DIGITAL SCREENING BILATERAL MAMMOGRAM WITH CAD

[L MLO]
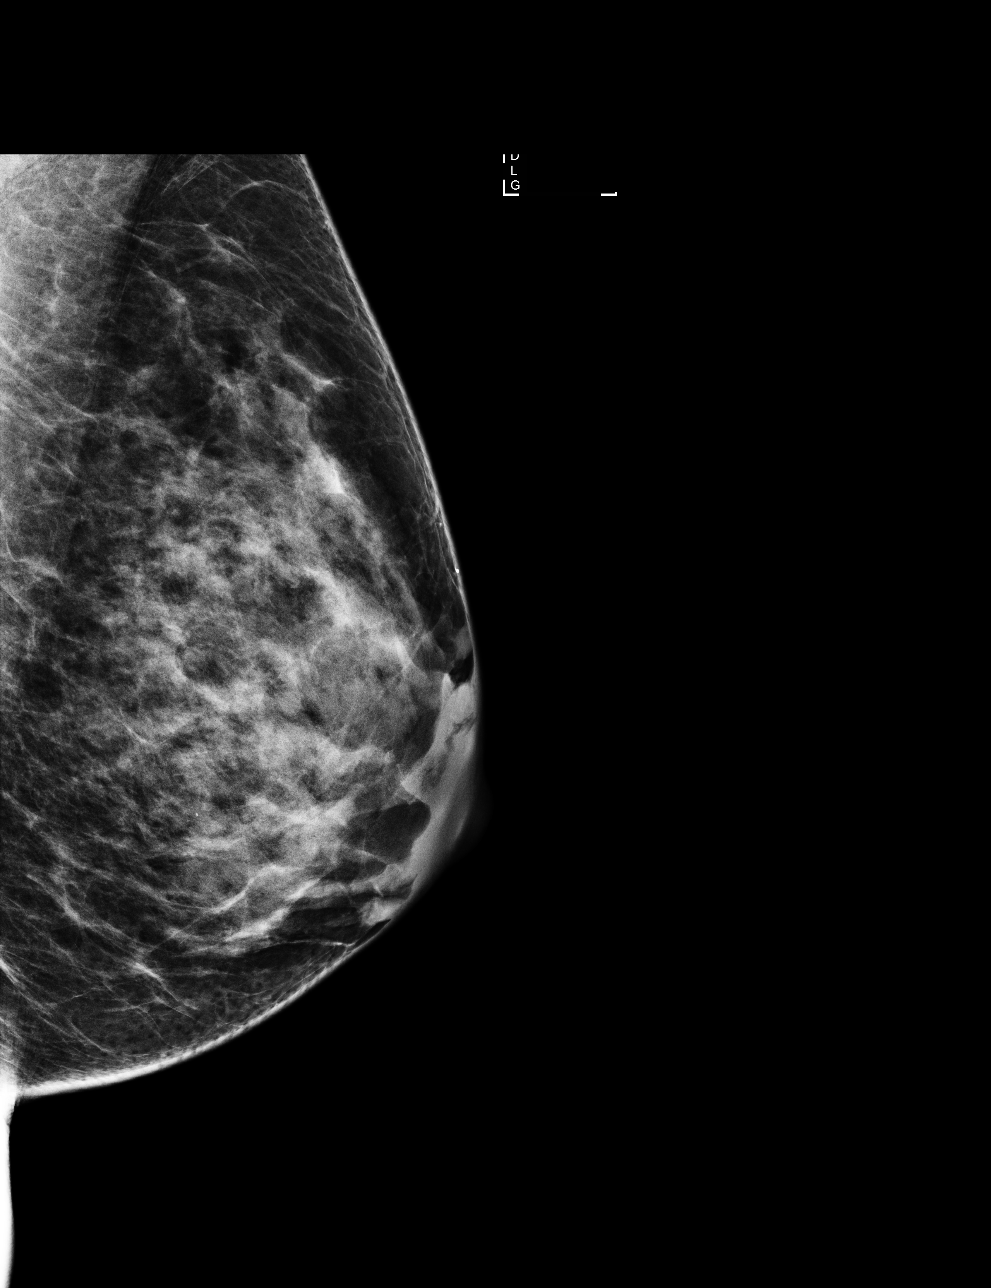

[L CC]
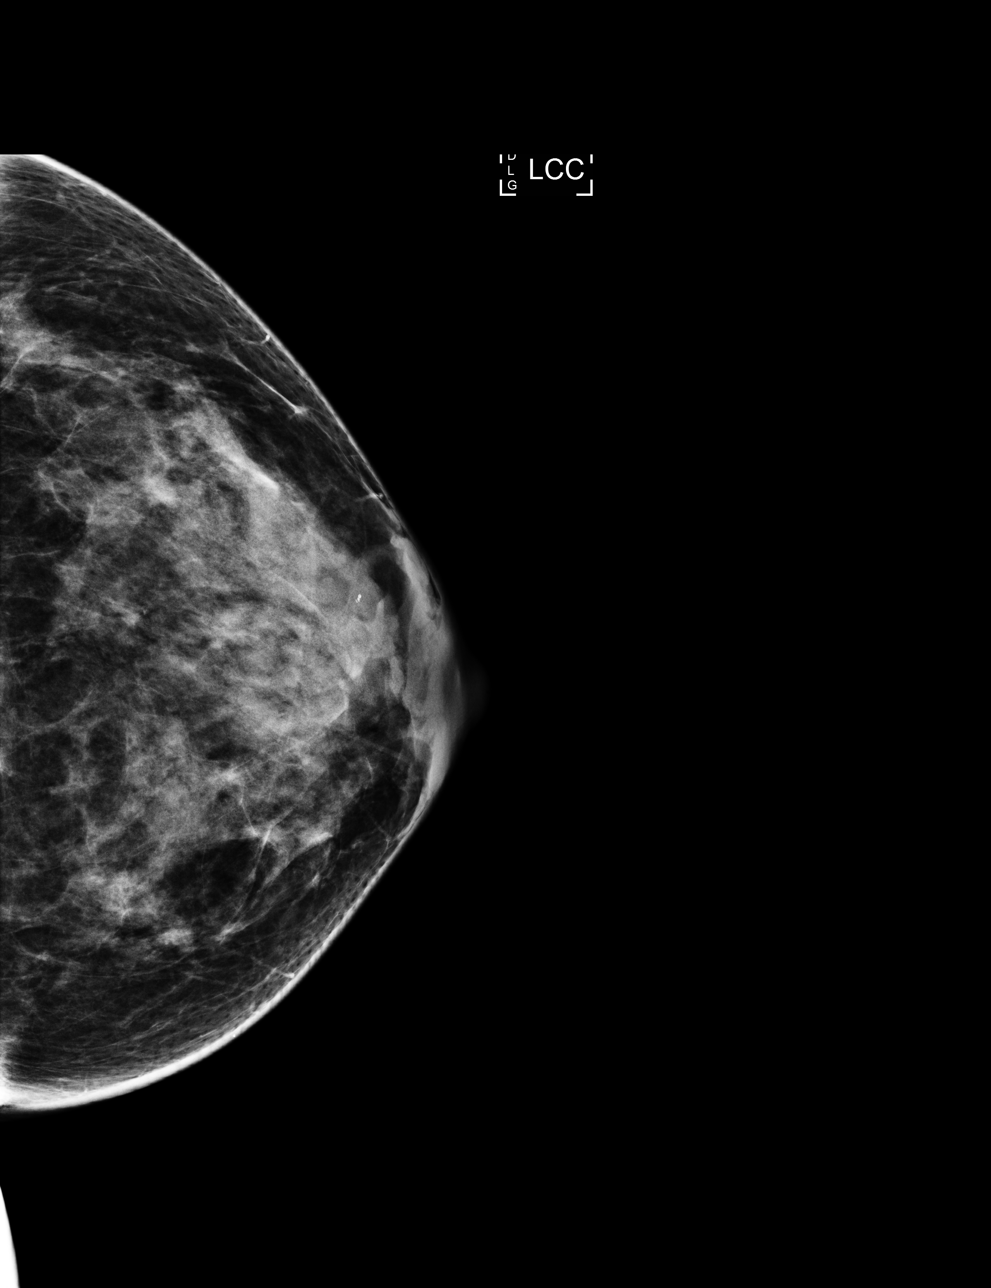

[R CC]
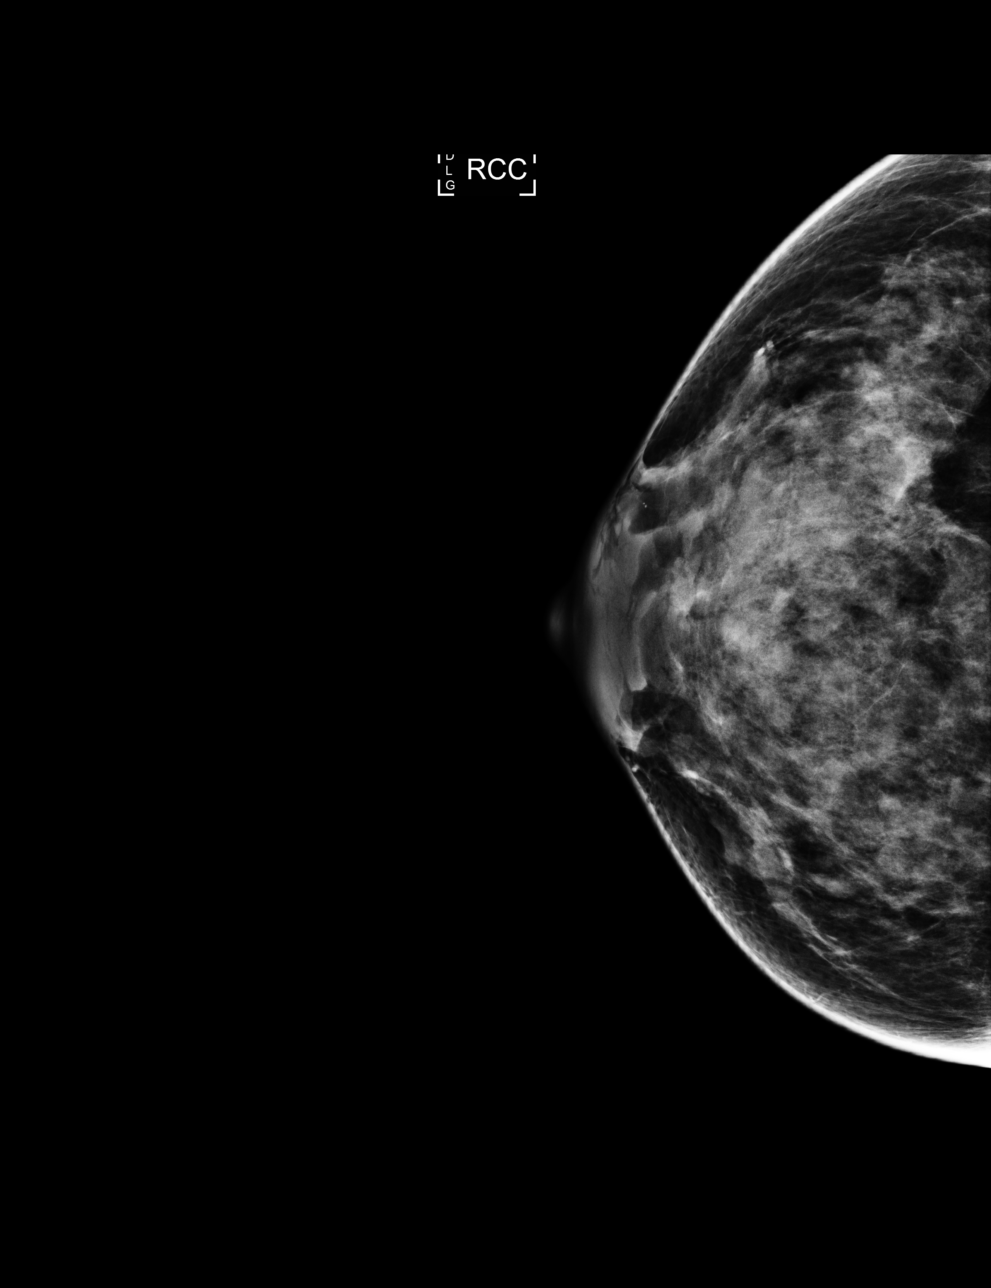

[R MLO]
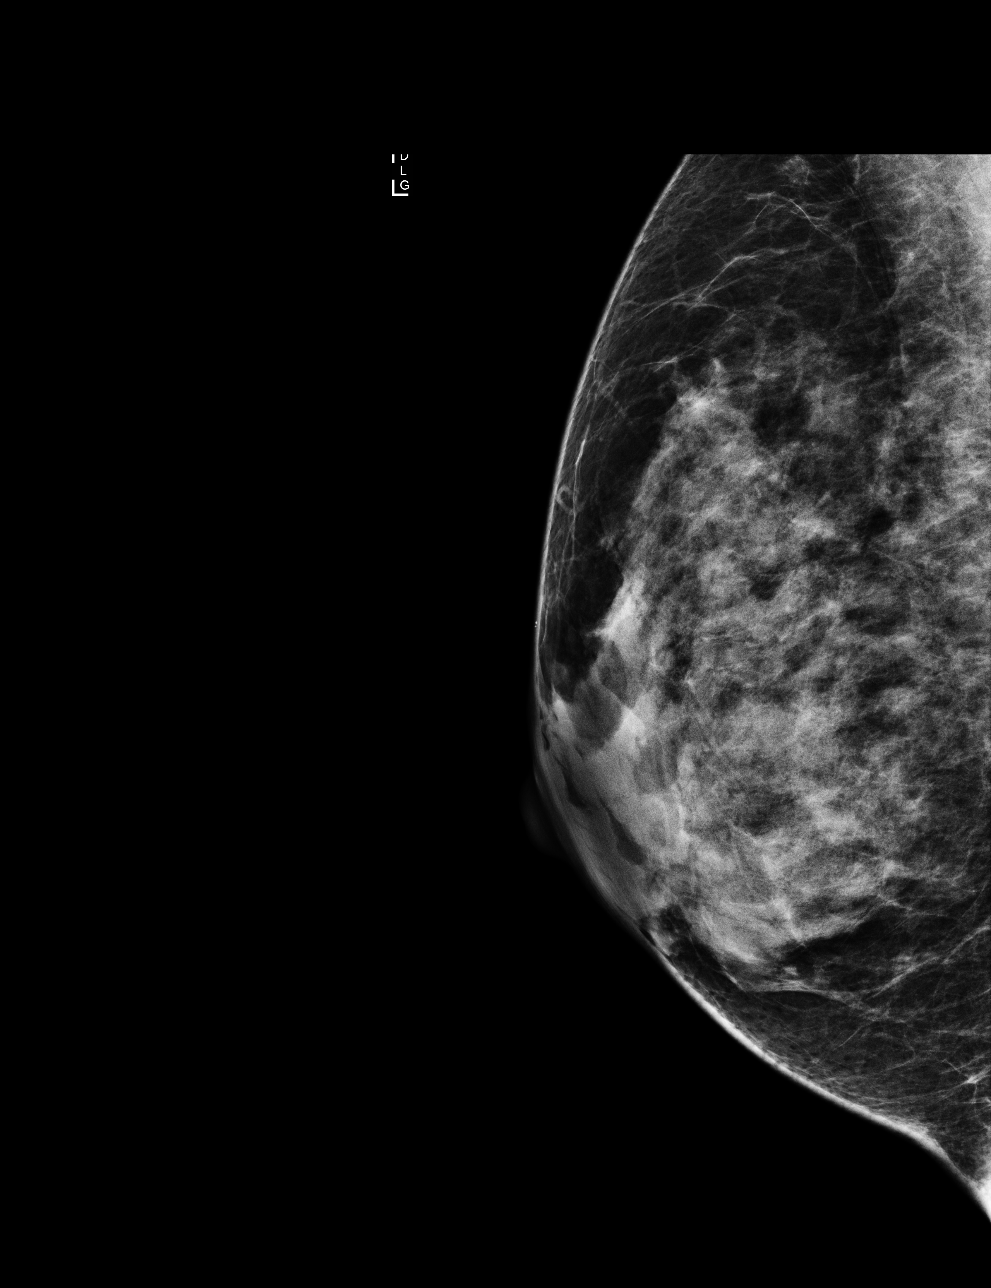

[4 of 4 positions shown; findings below may reference images not displayed]

ACR Breast Density Category c: The breast tissue is heterogeneously
dense, which may obscure small masses.
FINDINGS: There are no findings suspicious for malignancy. Images were
processed with CAD.
IMPRESSION: No mammographic evidence of malignancy. A result letter of this
screening mammogram will be mailed directly to the patient.

RECOMMENDATION:
Screening mammogram in one year. (Code:YJ-[id number])

BI-RADS CATEGORY  1: Negative.

## 2022-06-01 ENCOUNTER — Other Ambulatory Visit: Payer: Self-pay | Admitting: Family Medicine

## 2022-06-01 DIAGNOSIS — Z1231 Encounter for screening mammogram for malignant neoplasm of breast: Secondary | ICD-10-CM

## 2022-07-15 ENCOUNTER — Ambulatory Visit
Admission: RE | Admit: 2022-07-15 | Discharge: 2022-07-15 | Disposition: A | Discharge status: Home / Self Care | Payer: No Typology Code available for payment source | Source: Ambulatory Visit | Attending: Family Medicine | Admitting: Family Medicine

## 2022-07-15 DIAGNOSIS — Z1231 Encounter for screening mammogram for malignant neoplasm of breast: Secondary | ICD-10-CM

## 2023-06-15 ENCOUNTER — Other Ambulatory Visit: Payer: Self-pay | Admitting: Family Medicine

## 2023-06-15 DIAGNOSIS — Z1231 Encounter for screening mammogram for malignant neoplasm of breast: Secondary | ICD-10-CM

## 2023-07-20 ENCOUNTER — Ambulatory Visit

## 2023-08-19 ENCOUNTER — Ambulatory Visit

## 2023-08-26 ENCOUNTER — Ambulatory Visit
Admission: RE | Admit: 2023-08-26 | Discharge: 2023-08-26 | Disposition: A | Discharge status: Home / Self Care | Payer: Self-pay | Source: Ambulatory Visit | Attending: Family Medicine

## 2023-08-26 DIAGNOSIS — Z1231 Encounter for screening mammogram for malignant neoplasm of breast: Secondary | ICD-10-CM
